# Patient Record
Sex: Male | Born: 1955 | Race: White | Hispanic: No | Marital: Single | Smoking: Never smoker
Health system: Southern US, Community
[De-identification: ages and names within clinical notes are randomized; demographics above are authoritative.]

## PROBLEM LIST (undated history)

## (undated) DIAGNOSIS — E785 Hyperlipidemia, unspecified: Secondary | ICD-10-CM

## (undated) DIAGNOSIS — Z87442 Personal history of urinary calculi: Secondary | ICD-10-CM

## (undated) DIAGNOSIS — K635 Polyp of colon: Secondary | ICD-10-CM

## (undated) HISTORY — PX: CYST EXCISION: SHX5701

## (undated) HISTORY — PX: WISDOM TOOTH EXTRACTION: SHX21

## (undated) HISTORY — PX: COLONOSCOPY: SHX174

---

## 2016-11-19 ENCOUNTER — Encounter: Payer: Self-pay | Admitting: *Deleted

## 2016-11-20 ENCOUNTER — Encounter
Admission: RE | Disposition: A | Discharge status: Home / Self Care | Payer: Self-pay | Source: Ambulatory Visit | Attending: Internal Medicine

## 2016-11-20 ENCOUNTER — Ambulatory Visit
Admission: RE | Admit: 2016-11-20 | Discharge: 2016-11-20 | Disposition: A | Discharge status: Home / Self Care | Payer: BLUE CROSS/BLUE SHIELD | Source: Ambulatory Visit | Attending: Internal Medicine | Admitting: Internal Medicine

## 2016-11-20 ENCOUNTER — Encounter: Payer: Self-pay | Admitting: *Deleted

## 2016-11-20 ENCOUNTER — Ambulatory Visit: Payer: BLUE CROSS/BLUE SHIELD | Admitting: Anesthesiology

## 2016-11-20 DIAGNOSIS — K635 Polyp of colon: Secondary | ICD-10-CM | POA: Diagnosis not present

## 2016-11-20 DIAGNOSIS — Z1211 Encounter for screening for malignant neoplasm of colon: Secondary | ICD-10-CM | POA: Diagnosis not present

## 2016-11-20 DIAGNOSIS — Z87442 Personal history of urinary calculi: Secondary | ICD-10-CM | POA: Diagnosis not present

## 2016-11-20 DIAGNOSIS — Z86010 Personal history of colonic polyps: Secondary | ICD-10-CM | POA: Insufficient documentation

## 2016-11-20 DIAGNOSIS — Z79899 Other long term (current) drug therapy: Secondary | ICD-10-CM | POA: Diagnosis not present

## 2016-11-20 DIAGNOSIS — E785 Hyperlipidemia, unspecified: Secondary | ICD-10-CM | POA: Diagnosis not present

## 2016-11-20 DIAGNOSIS — K5730 Diverticulosis of large intestine without perforation or abscess without bleeding: Secondary | ICD-10-CM | POA: Insufficient documentation

## 2016-11-20 HISTORY — DX: Polyp of colon: K63.5

## 2016-11-20 HISTORY — PX: COLONOSCOPY WITH PROPOFOL: SHX5780

## 2016-11-20 HISTORY — DX: Personal history of urinary calculi: Z87.442

## 2016-11-20 HISTORY — DX: Hyperlipidemia, unspecified: E78.5

## 2016-11-20 SURGERY — COLONOSCOPY WITH PROPOFOL
Anesthesia: General

## 2016-11-20 MED ORDER — PROPOFOL 500 MG/50ML IV EMUL
INTRAVENOUS | Status: DC | PRN
  Administered 2016-11-20: 150 ug/kg/min via INTRAVENOUS

## 2016-11-20 MED ORDER — PROPOFOL 10 MG/ML IV BOLUS
INTRAVENOUS | Status: DC | PRN
  Administered 2016-11-20: 50 mg via INTRAVENOUS
  Administered 2016-11-20: 20 mg via INTRAVENOUS

## 2016-11-20 MED ORDER — PROPOFOL 500 MG/50ML IV EMUL
INTRAVENOUS | Status: AC
  Filled 2016-11-20: qty 50

## 2016-11-20 MED ORDER — EPHEDRINE SULFATE 50 MG/ML IJ SOLN
INTRAMUSCULAR | Status: AC
  Filled 2016-11-20: qty 1

## 2016-11-20 MED ORDER — SODIUM CHLORIDE 0.9 % IV SOLN
INTRAVENOUS | Status: DC
  Administered 2016-11-20: 1000 mL via INTRAVENOUS

## 2016-11-20 MED ORDER — EPHEDRINE SULFATE 50 MG/ML IJ SOLN
INTRAMUSCULAR | Status: DC | PRN
  Administered 2016-11-20 (×3): 10 mg via INTRAVENOUS

## 2016-11-20 MED ORDER — LIDOCAINE HCL (CARDIAC) 20 MG/ML IV SOLN
INTRAVENOUS | Status: DC | PRN
  Administered 2016-11-20: 60 mg via INTRAVENOUS

## 2016-11-20 MED ORDER — LIDOCAINE HCL (PF) 2 % IJ SOLN
INTRAMUSCULAR | Status: AC
  Filled 2016-11-20: qty 10

## 2016-11-20 NOTE — H&P (Signed)
Outpatient short stay form Pre-procedure 11/20/2016 2:51 PM Dema Timmons K. Norma Fredrickson, M.D.  Primary Physician: Dr. Zada Finders  Reason for visit:  Personal hx of colon polyps.  History of present illness:  Patient presents for colon polyp surveillance. The patient denies abdominal pain, abnormal weight loss or rectal bleeding. He had what appeared to be an episode of mild diverticulitis about 2 months ago and symptoms of LLQ pain were resolved with a short course of Augmentin.     Current Facility-Administered Medications:  .  0.9 %  sodium chloride infusion, , Intravenous, Continuous, [place], Boykin Nearing, MD, Last Rate: 20 mL/hr at 11/20/16 1355, 1,000 mL at 11/20/16 1355  No prescriptions prior to admission.     No Known Allergies   Past Medical History:  Diagnosis Date  . Colon polyps   . History of kidney stones   . Hyperlipidemia     Review of systems:   Patient denies SOB, CP, syncope, dysuria, hx seizure or stroke. Denies skin rash. Denies memory loss.   Physical Exam  General appearance: alert, cooperative and appears stated age Resp: clear to auscultation bilaterally Cardio: regular rate and rhythm, S1, S2 normal, no murmur, click, rub or gallop GI: soft, non-tender; bowel sounds normal; no masses,  no organomegaly Extremities: extremities normal, atraumatic, no cyanosis or edema     Planned procedures: Proceed with colonoscopy. The patient understands the nature of the planned procedure, indications, risks, alternatives and potential complications including but not limited to bleeding, infection, perforation, damage to internal organs and possible oversedation/side effects from anesthesia. The patient agrees and gives consent to proceed.  Please refer to procedure notes for findings, recommendations and patient disposition/instructions.    Sayward Horvath K. Norma Fredrickson, M.D. Gastroenterology 11/20/2016  2:51 PM

## 2016-11-20 NOTE — Transfer of Care (Signed)
Immediate Anesthesia Transfer of Care Note  Patient: Ivan Lowery  Procedure(s) Performed: COLONOSCOPY WITH PROPOFOL (N/A )  Patient Location: PACU  Anesthesia Type:General  Level of Consciousness: awake, alert  and oriented  Airway & Oxygen Therapy: Patient Spontanous Breathing and Patient connected to nasal cannula oxygen  Post-op Assessment: Report given to RN and Post -op Vital signs reviewed and stable  Post vital signs: Reviewed and stable  Last Vitals:  Vitals:   11/20/16 1517 11/20/16 1519  BP: (!) 101/44 (!) 101/44  Pulse: 65 63  Resp: 15 15  Temp: (!) 36.1 C (!) 36.1 C  SpO2: 99% 98%    Last Pain:  Vitals:   11/20/16 1519  TempSrc: Tympanic         Complications: No apparent anesthesia complications

## 2016-11-20 NOTE — Op Note (Signed)
[facility] Gastroenterology Patient Name: Ivan Lowery Procedure Date: 11/20/2016 2:46 PM MRN: [id number] Account #: [id number] Date of Birth: January 10, 1956 Admit Type: Outpatient Age: 61 Room: [facility] ENDO ROOM 1 Gender: Male Note Status: Finalized Procedure:            Colonoscopy Indications:          High risk colon cancer surveillance: Personal history                        of colonic polyps Providers:            Boykin Nearing. Norma Fredrickson MD, MD Referring MD:         Nat Christen. Zada Finders, MD (Referring MD) Medicines:            Propofol per Anesthesia Complications:        No immediate complications. Procedure:            Pre-Anesthesia Assessment:                       - ASA Grade Assessment: III - A patient with severe                        systemic disease.                       - After reviewing the risks and benefits, the patient                        was deemed in satisfactory condition to undergo the                        procedure.                       After obtaining informed consent, the colonoscope was                        passed under direct vision. Throughout the procedure,                        the patient's blood pressure, pulse, and oxygen                        saturations were monitored continuously. The                        Colonoscope was introduced through the anus and                        advanced to the the cecum, identified by appendiceal                        orifice and ileocecal valve. The colonoscopy was                        performed without difficulty. The patient tolerated the                        procedure well. The quality of the bowel preparation  was adequate. The ileocecal valve, appendiceal orifice,                        and rectum were photographed. Findings:      The perianal and digital rectal examinations were normal.      A few small-mouthed diverticula were found in the left colon. There was        no evidence of diverticular bleeding.      A 3 mm polyp was found in the cecum. The polyp was sessile. The polyp       was removed with a jumbo cold forceps. Resection and retrieval were       complete.      A tattoo was seen in the transverse colon. A post-polypectomy scar was       found at the tattoo site. No biopsies or other specimens were collected       for this exam.      Non-bleeding internal hemorrhoids were found during retroflexion. The       hemorrhoids were mild and Grade I (internal hemorrhoids that do not       prolapse). Impression:           - Mild diverticulosis in the left colon. There was no                        evidence of diverticular bleeding.                       - One 3 mm polyp in the cecum, removed with a jumbo                        cold forceps. Resected and retrieved. Recommendation:       - Await pathology results.                       - Repeat colonoscopy in 5 years for surveillance based                        on pathology results.                       - Return to GI office PRN.                       - Patient has a contact number available for                        emergencies. The signs and symptoms of potential                        delayed complications were discussed with the patient.                        Return to normal activities tomorrow. Written discharge                        instructions were provided to the patient.                       - Resume previous diet.                       -  Await pathology results. Procedure Code(s):    --- Professional ---                       [phone], Colonoscopy, flexible; with biopsy, single or                        multiple Diagnosis Code(s):    --- Professional ---                       Z86.010, Personal history of colonic polyps                       D12.0, Benign neoplasm of cecum                       K57.30, Diverticulosis of large intestine without                        perforation  or abscess without bleeding CPT copyright 2016 [employer]. All rights reserved. The codes documented in this report are preliminary and upon coder review may  be revised to meet current compliance requirements. Stanton Kidney MD, MD 11/20/2016 3:18:35 PM This report has been signed electronically. Number of Addenda: 0 Note Initiated On: 11/20/2016 2:46 PM Scope Withdrawal Time: 0 hours 7 minutes 8 seconds  Total Procedure Duration: 0 hours 13 minutes 34 seconds       [facility]

## 2016-11-20 NOTE — Anesthesia Preprocedure Evaluation (Signed)
Anesthesia Evaluation  Patient identified by MRN, date of birth, ID band Patient awake    Reviewed: Allergy & Precautions, NPO status , Patient's Chart, lab work & pertinent test results  History of Anesthesia Complications Negative for: history of anesthetic complications  Airway Mallampati: II  TM Distance: >3 FB Neck ROM: Full    Dental no notable dental hx.    Pulmonary neg pulmonary ROS, neg sleep apnea, neg COPD,    breath sounds clear to auscultation- rhonchi (-) wheezing      Cardiovascular Exercise Tolerance: Good (-) hypertension(-) CAD and (-) Past MI  Rhythm:Regular Rate:Normal - Systolic murmurs and - Diastolic murmurs    Neuro/Psych negative neurological ROS  negative psych ROS   GI/Hepatic negative GI ROS, Neg liver ROS,   Endo/Other  negative endocrine ROSneg diabetes  Renal/GU negative Renal ROS     Musculoskeletal negative musculoskeletal ROS (+)   Abdominal (+) - obese,   Peds  Hematology negative hematology ROS (+)   Anesthesia Other Findings   Reproductive/Obstetrics                             Anesthesia Physical Anesthesia Plan  ASA: I  Anesthesia Plan: General   Post-op Pain Management:    Induction: Intravenous  PONV Risk Score and Plan: 1 and Propofol infusion  Airway Management Planned: Natural Airway  Additional Equipment:   Intra-op Plan:   Post-operative Plan:   Informed Consent: I have reviewed the patients History and Physical, chart, labs and discussed the procedure including the risks, benefits and alternatives for the proposed anesthesia with the patient or authorized representative who has indicated his/her understanding and acceptance.     Dental advisory given  Plan Discussed with: CRNA and Anesthesiologist  Anesthesia Plan Comments:        Anesthesia Quick Evaluation  

## 2016-11-20 NOTE — Anesthesia Post-op Follow-up Note (Signed)
Anesthesia QCDR form completed.        

## 2016-11-20 NOTE — Interval H&P Note (Signed)
History and Physical Interval Note:  11/20/2016 2:54 PM  Ivan Lowery  has presented today for surgery, with the diagnosis of HX COLON CA  The various methods of treatment have been discussed with the patient and family. After consideration of risks, benefits and other options for treatment, the patient has consented to  Procedure(s): COLONOSCOPY WITH PROPOFOL (N/A) as a surgical intervention .  The patient's history has been reviewed, patient examined, no change in status, stable for surgery.  I have reviewed the patient's chart and labs.  Questions were answered to the patient's satisfaction.     [place], [place]

## 2016-11-20 NOTE — Anesthesia Procedure Notes (Signed)
Date/Time: 11/20/2016 2:53 PM Performed by: Marlana Salvage Pre-anesthesia Checklist: Patient identified, Emergency Drugs available, Suction available, Patient being monitored and Timeout performed Patient Re-evaluated:Patient Re-evaluated prior to induction Oxygen Delivery Method: Nasal cannula Placement Confirmation: positive ETCO2

## 2016-11-21 ENCOUNTER — Encounter: Payer: Self-pay | Admitting: Internal Medicine

## 2016-11-21 NOTE — Anesthesia Postprocedure Evaluation (Signed)
Anesthesia Post Note  Patient: Ivan Lowery  Procedure(s) Performed: COLONOSCOPY WITH PROPOFOL (N/A )  Patient location during evaluation: PACU Anesthesia Type: General Level of consciousness: awake and alert and oriented Pain management: pain level controlled Vital Signs Assessment: post-procedure vital signs reviewed and stable Respiratory status: spontaneous breathing Cardiovascular status: blood pressure returned to baseline Anesthetic complications: no     Last Vitals:  Vitals:   11/20/16 1539 11/20/16 1549  BP: 119/68 116/67  Pulse: 69 71  Resp: 19 13  Temp:    SpO2: 99% 97%    Last Pain:  Vitals:   11/20/16 1519  TempSrc: Tympanic                 Vienna Folden

## 2016-11-22 LAB — SURGICAL PATHOLOGY

## 2019-09-21 ENCOUNTER — Other Ambulatory Visit: Payer: Self-pay | Admitting: Family Medicine

## 2019-09-21 DIAGNOSIS — L304 Erythema intertrigo: Secondary | ICD-10-CM

## 2019-09-21 DIAGNOSIS — R160 Hepatomegaly, not elsewhere classified: Secondary | ICD-10-CM

## 2019-09-28 ENCOUNTER — Other Ambulatory Visit: Payer: Self-pay

## 2019-09-28 ENCOUNTER — Ambulatory Visit
Admission: RE | Admit: 2019-09-28 | Discharge: 2019-09-28 | Disposition: A | Discharge status: Home / Self Care | Payer: BC Managed Care – PPO | Source: Ambulatory Visit | Attending: Family Medicine | Admitting: Family Medicine

## 2019-09-28 DIAGNOSIS — L304 Erythema intertrigo: Secondary | ICD-10-CM | POA: Insufficient documentation

## 2019-09-28 DIAGNOSIS — R160 Hepatomegaly, not elsewhere classified: Secondary | ICD-10-CM | POA: Diagnosis present

## 2021-02-05 IMAGING — US US ABDOMEN LIMITED
1 series · 14 of 25 positions shown · non-contrast
Comparison: None.

CLINICAL DATA: Elevated liver function tests.

EXAM:
ULTRASOUND ABDOMEN LIMITED RIGHT UPPER QUADRANT

[Series 1: us abdomen limited · 0.22mm/px · 14 of 39 slices shown]
[im 1/39]
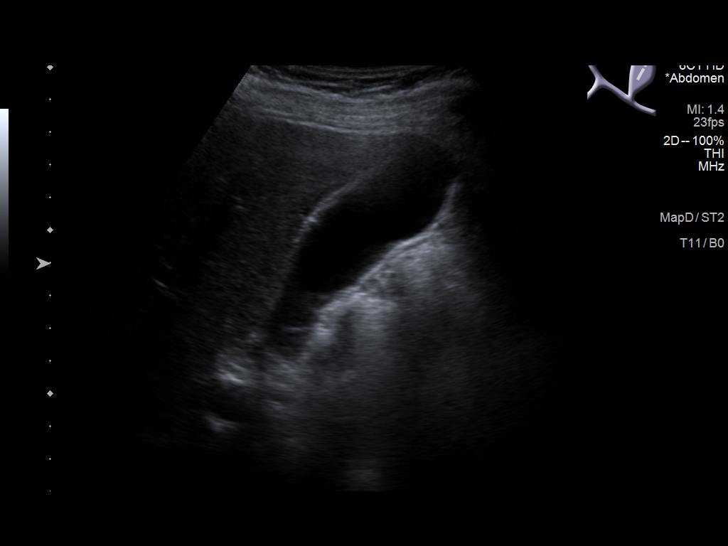
[im 4/39]
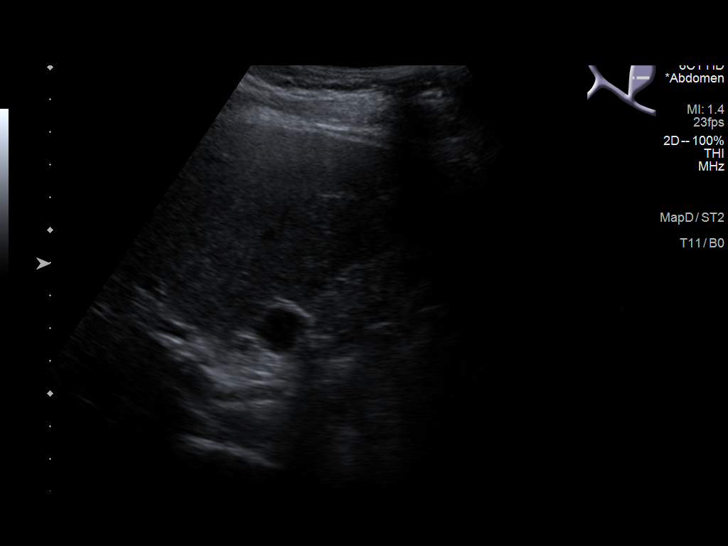
[im 7/39]
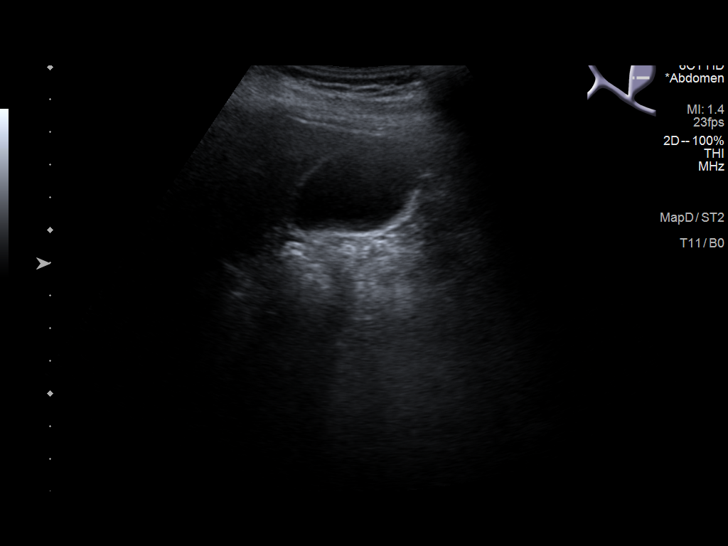
[im 10/39]
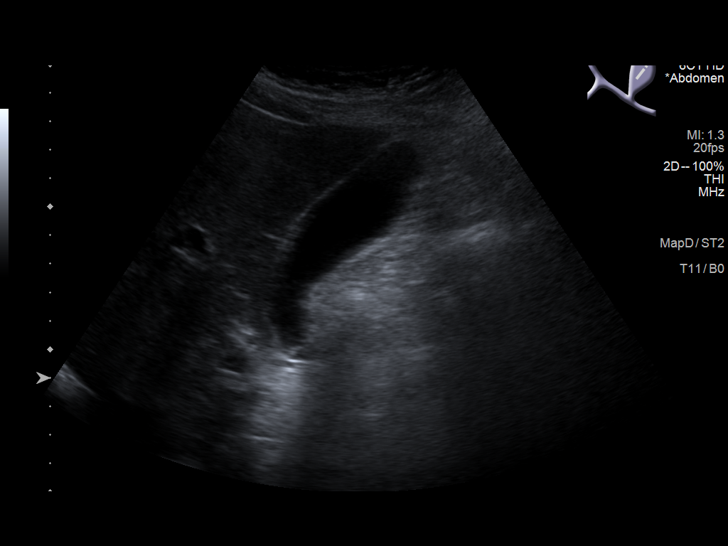
[im 13/39]
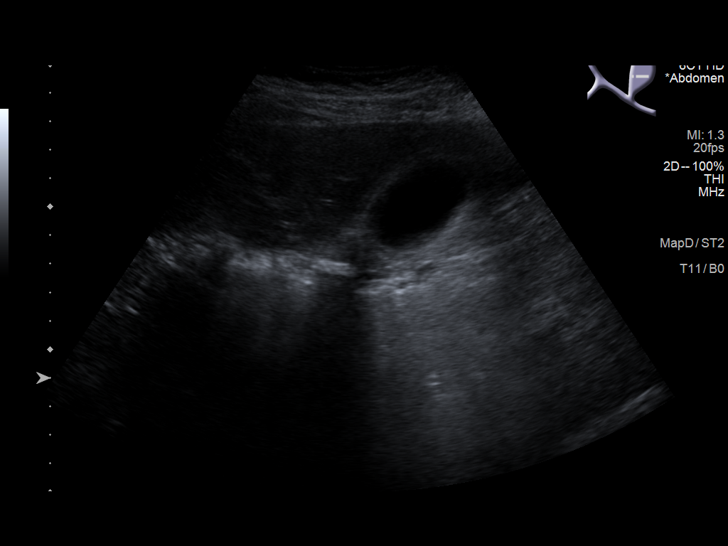
[im 15/39]
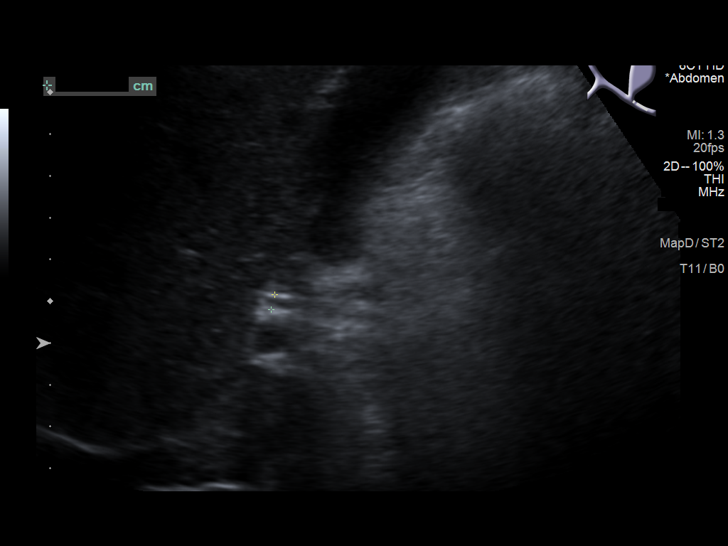
[im 18/39]
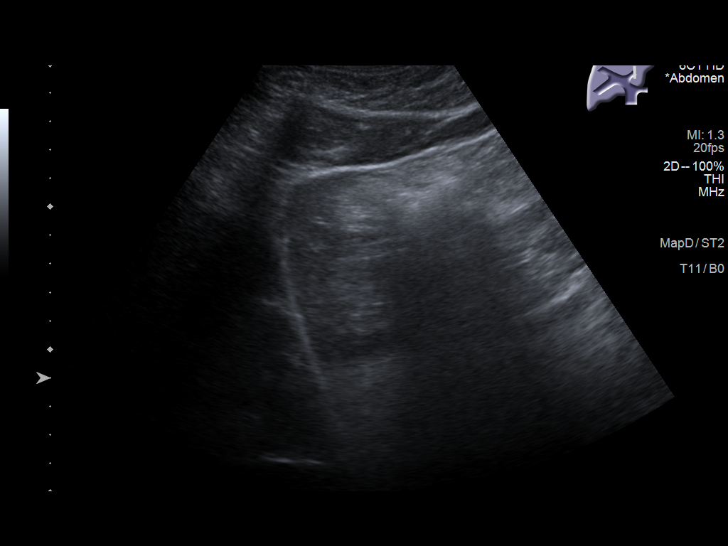
[im 21/39]
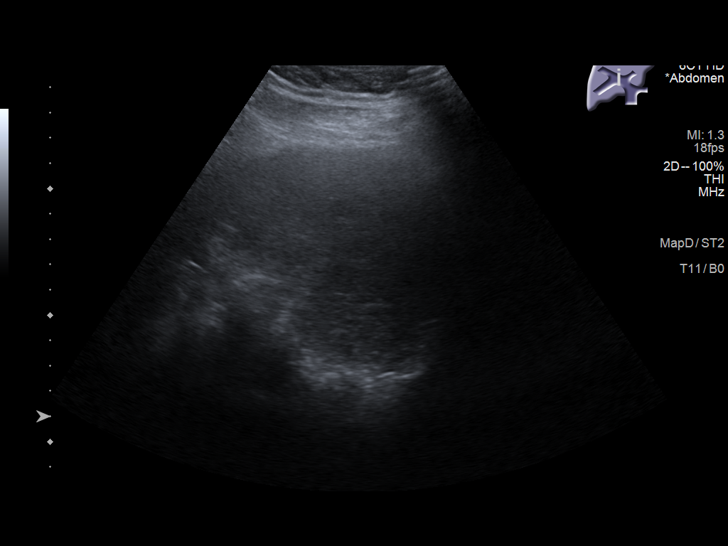
[im 24/39]
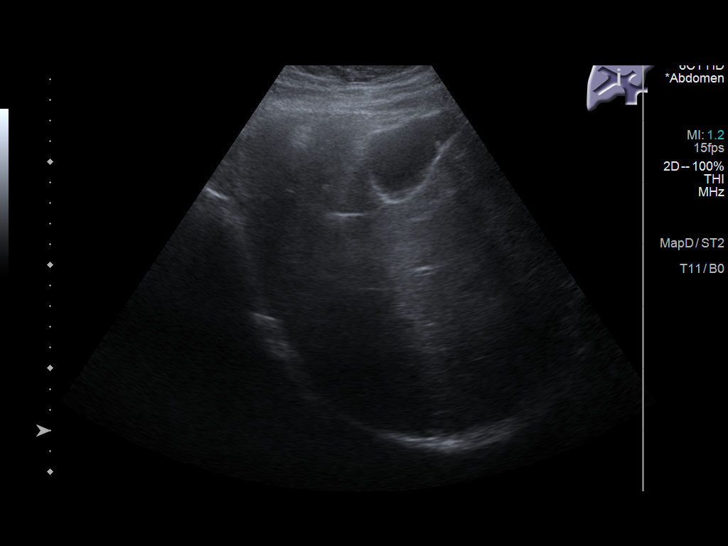
[im 26/39]
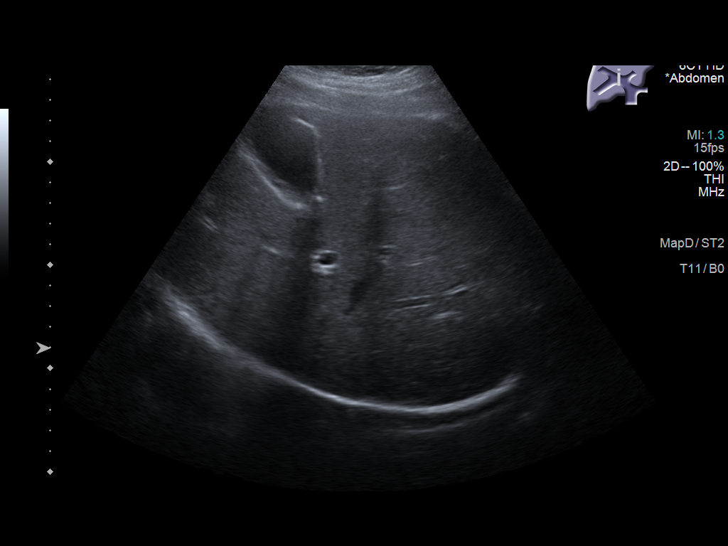
[im 29/39]
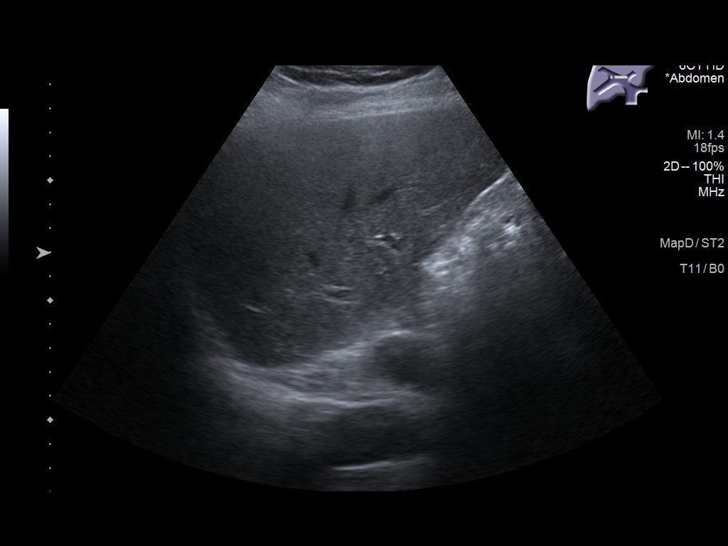
[im 32/39]
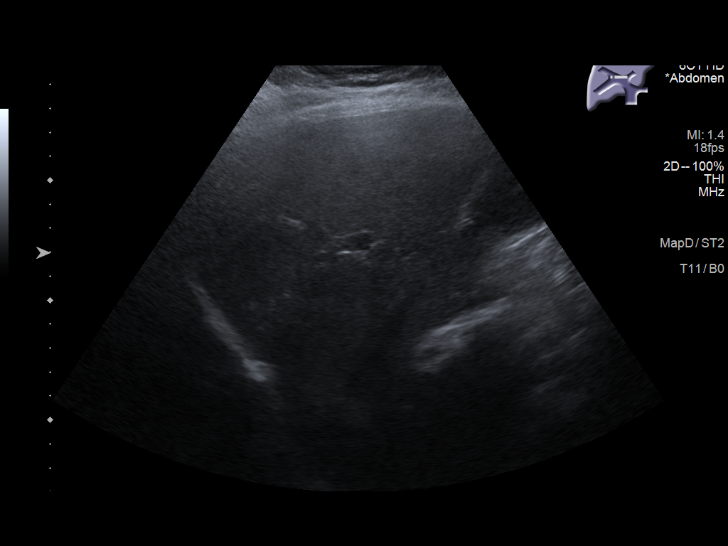
[im 35/39]
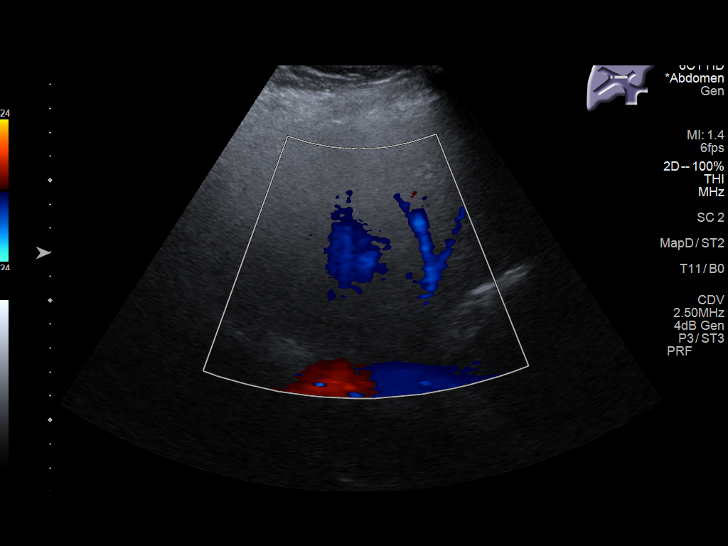
[im 39/39]
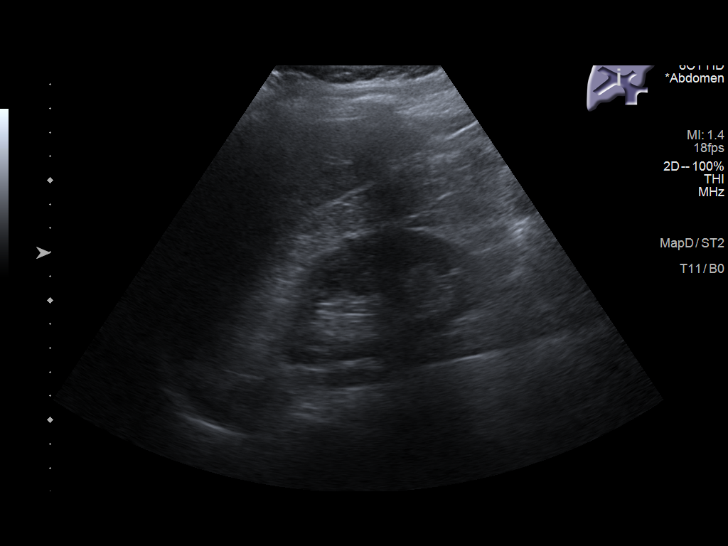

[14 of 25 positions shown; findings below may reference images not displayed]

FINDINGS: Gallbladder:

No gallstones or wall thickening visualized. No sonographic Murphy
sign noted by sonographer.

Common bile duct:

Diameter: 0.4 cm

Liver:

No focal lesion. Echogenicity is mildly increased. Portal vein is
patent on color Doppler imaging with normal direction of blood flow
towards the liver.

Other: None.
IMPRESSION: Mild appearing fatty infiltration of the liver.  Otherwise negative.
